# Patient Record
Sex: Female | Born: 2000 | Hispanic: No | Smoking: Current some day smoker
Health system: Southern US, Community
[De-identification: ages and names within clinical notes are randomized; demographics above are authoritative.]

---

## 2020-12-22 ENCOUNTER — Other Ambulatory Visit: Payer: Self-pay

## 2020-12-22 DIAGNOSIS — Z5321 Procedure and treatment not carried out due to patient leaving prior to being seen by health care provider: Secondary | ICD-10-CM | POA: Insufficient documentation

## 2020-12-22 DIAGNOSIS — Z3A00 Weeks of gestation of pregnancy not specified: Secondary | ICD-10-CM | POA: Insufficient documentation

## 2020-12-22 DIAGNOSIS — O26851 Spotting complicating pregnancy, first trimester: Secondary | ICD-10-CM | POA: Insufficient documentation

## 2020-12-22 DIAGNOSIS — R110 Nausea: Secondary | ICD-10-CM | POA: Insufficient documentation

## 2020-12-22 LAB — URINALYSIS, COMPLETE (UACMP) WITH MICROSCOPIC
Bilirubin Urine: NEGATIVE
Glucose, UA: NEGATIVE mg/dL
Ketones, ur: NEGATIVE mg/dL
Leukocytes,Ua: NEGATIVE
Nitrite: NEGATIVE
Protein, ur: NEGATIVE mg/dL
Specific Gravity, Urine: 1.005 (ref 1.005–1.030)
pH: 7 (ref 5.0–8.0)

## 2020-12-22 LAB — POC URINE PREG, ED: Preg Test, Ur: NEGATIVE

## 2020-12-22 NOTE — ED Triage Notes (Signed)
Pt reports that she has not had a period for over a month and thinks that she may be pregnant because she has been drinking water a lot and eats all the time and has had some nausea. She states that she began spotting today. She is worried because her grandmother had 9 miscarriages. She just wants a pregnancy test to find out for sure if she is pregnant

## 2020-12-23 ENCOUNTER — Emergency Department
Admission: EM | Admit: 2020-12-23 | Discharge: 2020-12-23 | Disposition: A | Discharge status: Home / Self Care | Payer: Self-pay | Attending: Emergency Medicine | Admitting: Emergency Medicine

## 2020-12-23 ENCOUNTER — Emergency Department: Payer: Self-pay

## 2020-12-23 ENCOUNTER — Other Ambulatory Visit: Payer: Self-pay

## 2020-12-23 DIAGNOSIS — R102 Pelvic and perineal pain: Secondary | ICD-10-CM | POA: Insufficient documentation

## 2020-12-23 DIAGNOSIS — M7989 Other specified soft tissue disorders: Secondary | ICD-10-CM | POA: Insufficient documentation

## 2020-12-23 DIAGNOSIS — R140 Abdominal distension (gaseous): Secondary | ICD-10-CM | POA: Insufficient documentation

## 2020-12-23 DIAGNOSIS — R110 Nausea: Secondary | ICD-10-CM | POA: Insufficient documentation

## 2020-12-23 LAB — COMPREHENSIVE METABOLIC PANEL
ALT: 28 U/L (ref 0–44)
AST: 23 U/L (ref 15–41)
Albumin: 4.4 g/dL (ref 3.5–5.0)
Alkaline Phosphatase: 66 U/L (ref 38–126)
Anion gap: 8 (ref 5–15)
BUN: 9 mg/dL (ref 6–20)
CO2: 25 mmol/L (ref 22–32)
Calcium: 9 mg/dL (ref 8.9–10.3)
Chloride: 106 mmol/L (ref 98–111)
Creatinine, Ser: 0.47 mg/dL (ref 0.44–1.00)
GFR, Estimated: >60 mL/min (ref >60)
Glucose, Bld: 109 mg/dL — ABNORMAL HIGH (ref 70–99)
Potassium: 3.8 mmol/L (ref 3.5–5.1)
Sodium: 139 mmol/L (ref 135–145)
Total Bilirubin: 1 mg/dL (ref 0.3–1.2)
Total Protein: 7.4 g/dL (ref 6.5–8.1)

## 2020-12-23 LAB — HCG, QUANTITATIVE, PREGNANCY: hCG, Beta Chain, Quant, S: <1 m[IU]/mL (ref <5)

## 2020-12-23 LAB — CBC WITH DIFFERENTIAL/PLATELET
Abs Immature Granulocytes: 0.05 10*3/uL (ref 0.00–0.07)
Basophils Absolute: 0.1 10*3/uL (ref 0.0–0.1)
Basophils Relative: 1 %
Eosinophils Absolute: 0.1 10*3/uL (ref 0.0–0.5)
Eosinophils Relative: 1 %
HCT: 37.8 % (ref 36.0–46.0)
Hemoglobin: 12.6 g/dL (ref 12.0–15.0)
Immature Granulocytes: 1 %
Lymphocytes Relative: 25 %
Lymphs Abs: 2.2 10*3/uL (ref 0.7–4.0)
MCH: 30.4 pg (ref 26.0–34.0)
MCHC: 33.3 g/dL (ref 30.0–36.0)
MCV: 91.1 fL (ref 80.0–100.0)
Monocytes Absolute: 0.6 10*3/uL (ref 0.1–1.0)
Monocytes Relative: 7 %
Neutro Abs: 5.9 10*3/uL (ref 1.7–7.7)
Neutrophils Relative %: 65 %
Platelets: 318 10*3/uL (ref 150–400)
RBC: 4.15 MIL/uL (ref 3.87–5.11)
RDW: 12.8 % (ref 11.5–15.5)
WBC: 8.8 10*3/uL (ref 4.0–10.5)
nRBC: 0 % (ref 0.0–0.2)

## 2020-12-23 LAB — POC URINE PREG, ED: Preg Test, Ur: NEGATIVE

## 2020-12-23 NOTE — ED Notes (Signed)
Patient verbalizes understanding of discharge instructions. Opportunity for questioning and answers were provided. Armband removed by staff, pt discharged from ED. Ambulated out to lobby  

## 2020-12-23 NOTE — ED Provider Notes (Signed)
[facility] Emergency Department Provider Note  ____________________________________________   Event Date/Time   First MD Initiated Contact with Patient 12/23/20 [phone]     (approximate)  I have reviewed the triage vital signs and the nursing notes.   HISTORY  Chief Complaint wants pregnancy test    HPI Tracey Contreras is a 20 y.o. female presents emergency department stating that she thinks she is pregnant.  Patient states her breasts have been tender, her abdomen has been bloated, she has had some swelling in her feet.  Patient is 1 month late for her menstrual cycle.  She denies any fever or chills.  Did have spotting but no large amount of bleeding.  Also complains of nausea  No past medical history on file.  There are no problems to display for this patient.     Prior to Admission medications   Not on File    Allergies Patient has no known allergies.  No family history on file.  Social History    Review of Systems  Constitutional: No fever/chills Eyes: No visual changes. ENT: No sore throat. Respiratory: Denies cough Cardiovascular: Denies chest pain Gastrointestinal: Denies abdominal pain, positive for abdominal bloating and pelvic pain Genitourinary: Negative for dysuria. Musculoskeletal: Negative for back pain. Skin: Negative for rash. Psychiatric: no mood changes,     ____________________________________________   PHYSICAL EXAM:  VITAL SIGNS: ED Triage Vitals [12/23/20 0536]  Enc Vitals Group     BP (!) 127/94     Pulse Rate (!) 102     Resp 20     Temp      Temp Source Oral     SpO2 100 %     Weight 110 lb 3.7 oz (50 kg)     Height 5\' 1"  (1.549 m)     Head Circumference      Peak Flow      Pain Score 0     Pain Loc      Pain Edu?      Excl. in GC?     Constitutional: Alert and oriented. Well appearing and in no acute distress. Eyes: Conjunctivae are normal.  Head: Atraumatic. Nose: No  congestion/rhinnorhea. Mouth/Throat: Mucous membranes are moist.   Neck:  supple no lymphadenopathy noted Cardiovascular: Normal rate, regular rhythm. Heart sounds are normal Respiratory: Normal respiratory effort.  No retractions, lungs c t a  Abd: soft nontender bs normal all 4 quad GU: deferred Musculoskeletal: FROM all extremities, warm and well perfused Neurologic:  Normal speech and language.  Skin:  Skin is warm, dry and intact. No rash noted. Psychiatric: Mood and affect are normal. Speech and behavior are normal.  ____________________________________________   LABS (all labs ordered are listed, but only abnormal results are displayed)  Labs Reviewed  COMPREHENSIVE METABOLIC PANEL - Abnormal; Notable for the following components:      Result Value   Glucose, Bld 109 (*)    All other components within normal limits  CBC WITH DIFFERENTIAL/PLATELET  HCG, QUANTITATIVE, PREGNANCY  POC URINE PREG, ED   ____________________________________________   ____________________________________________  RADIOLOGY  Ultrasound pelvis  ____________________________________________   PROCEDURES  Procedure(s) performed: No  Procedures    ____________________________________________   INITIAL IMPRESSION / ASSESSMENT AND PLAN / ED COURSE  Pertinent labs & imaging results that were available during my care of the patient were reviewed by me and considered in my medical decision making (see chart for details).   Patient's 20 year old female presents with concerns of pregnancy.  See HPI.  Physical exam shows patient be stable.  DDx: First trimester pregnancy, ectopic, ovarian cyst, false preg  Patient's labs are reassuring, CBC, comprehensive metabolic panel and beta-hCG are all negative for any acute abnormalities.  POC pregnancy is also negative  Ultrasound of the pelvis reviewed by me confirmed by radiology to be negative  Explained the findings to the patient, she is to  f/u with ob/gyn if she does not have a period this month, return to the ER if worsening abdominal pain Discharged in stable condition     Tracey Contreras was evaluated in Emergency Department on 12/23/2020 for the symptoms described in the history of present illness. She was evaluated in the context of the global COVID-19 pandemic, which necessitated consideration that the patient might be at risk for infection with the SARS-CoV-2 virus that causes COVID-19. Institutional protocols and algorithms that pertain to the evaluation of patients at risk for COVID-19 are in a state of rapid change based on information released by regulatory bodies including the CDC and federal and state organizations. These policies and algorithms were followed during the patient's care in the ED.    As part of my medical decision making, I reviewed the following data within the electronic MEDICAL RECORD NUMBER History obtained from family, Nursing notes reviewed and incorporated, Labs reviewed , Old chart reviewed, Radiograph reviewed , Notes from prior ED visits, and [place] Controlled Substance Database  ____________________________________________   FINAL CLINICAL IMPRESSION(S) / ED DIAGNOSES  Final diagnoses:  Pelvic pain  Abdominal bloating      NEW MEDICATIONS STARTED DURING THIS VISIT:  New Prescriptions   No medications on file     Note:  This document was prepared using Dragon voice recognition software and may include unintentional dictation errors.    Faythe Ghee, PA-C 12/23/20 1610    Gilles Chiquito, MD 12/23/20 [phone]

## 2020-12-23 NOTE — Discharge Instructions (Addendum)
Your pregnancy test is negative through urine and blood tests Your ultrasound is negative and does not show a current pregnancy or miscarriage Follow up with your regular doctor if not better in 3 days Return to the ER if worsening

## 2020-12-23 NOTE — ED Triage Notes (Addendum)
Pt states she had vaginal spotting last night that is gone currently. Pt states she is here to "find out how far along I am". Pt states she thinks she is pregnant, and her last period was "sometime last month". Pt was here yesterday and had a negative pregnancy test.

## 2020-12-23 NOTE — ED Provider Notes (Signed)
Emergency Medicine Provider Triage Evaluation Note  Tracey Contreras , a 20 y.o. female  was evaluated in triage.  Pt complains of persistent nausea, is concerned she could be pregnant.  Review of Systems  Positive: Nausea, abdominal distension Negative: Abdominal pain, dysuria, vomiting, diarrhea, vaginal bleeding  Physical Exam  BP (!) 127/94   Pulse (!) 102   Resp 20   Ht 5\' 1"  (1.549 m)   Wt 50 kg   LMP  (LMP Unknown)   SpO2 100%   BMI 20.83 kg/m  Gen:   Awake, no distress   Resp:  Normal effort  MSK:   Moves extremities without difficulty  Other:  No abdominal tenderness  Medical Decision Making  Medically screening exam initiated at 6:45 AM.  Appropriate orders placed.  Tracey Contreras was informed that the remainder of the evaluation will be completed by another provider, this initial triage assessment does not replace that evaluation, and the importance of remaining in the ED until their evaluation is complete.    Silverio Decamp, MD 12/23/20 [phone]

## 2021-04-20 ENCOUNTER — Encounter: Payer: Self-pay | Admitting: Emergency Medicine

## 2021-04-20 ENCOUNTER — Other Ambulatory Visit: Payer: Self-pay

## 2021-04-20 ENCOUNTER — Ambulatory Visit
Admission: EM | Admit: 2021-04-20 | Discharge: 2021-04-20 | Disposition: A | Discharge status: Home / Self Care | Payer: Self-pay | Attending: Emergency Medicine | Admitting: Emergency Medicine

## 2021-04-20 DIAGNOSIS — J069 Acute upper respiratory infection, unspecified: Secondary | ICD-10-CM | POA: Insufficient documentation

## 2021-04-20 DIAGNOSIS — L03011 Cellulitis of right finger: Secondary | ICD-10-CM | POA: Insufficient documentation

## 2021-04-20 DIAGNOSIS — Z20822 Contact with and (suspected) exposure to covid-19: Secondary | ICD-10-CM | POA: Insufficient documentation

## 2021-04-20 LAB — RAPID INFLUENZA A&B ANTIGENS
Influenza A (ARMC): NEGATIVE
Influenza B (ARMC): NEGATIVE

## 2021-04-20 MED ORDER — IPRATROPIUM BROMIDE 0.06 % NA SOLN: 2.0000 | Freq: Four times a day (QID) | NASAL | 12 refills | Status: DC

## 2021-04-20 MED ORDER — DOXYCYCLINE HYCLATE 100 MG PO CAPS
100.0000 mg | ORAL_CAPSULE | Freq: Two times a day (BID) | ORAL | 0 refills | Status: DC
Start: 2021-04-20 — End: 2021-06-18

## 2021-04-20 MED ORDER — PROMETHAZINE-PHENYLEPHRINE 6.25-5 MG/5ML PO SYRP: 5.0000 mL | ORAL_SOLUTION | Freq: Four times a day (QID) | ORAL | 0 refills | Status: DC | PRN

## 2021-04-20 MED ORDER — BENZONATATE 100 MG PO CAPS: 200.0000 mg | ORAL_CAPSULE | Freq: Three times a day (TID) | ORAL | 0 refills | Status: DC

## 2021-04-20 NOTE — ED Provider Notes (Signed)
[facility]    CSN: [id number] Arrival date & time: 04/20/21  1030      History   Chief Complaint Chief Complaint  Patient presents with   Fatigue   Cough    HPI Tracey Contreras is a 20 y.o. female.   HPI  20 year old female here for evaluation of respiratory complaints.  Patient reports that she has been experiencing runny nose with clear nasal discharge, nonproductive cough, and intermittent shortness of breath for the past 1 to 2 weeks.  Today she developed body aches and intermittent pain in the middle of her chest.  She also endorses a mild sore throat.  She denies fever, ear pain, wheezing, or GI complaints.  In addition, patient is requesting her right middle finger be evaluated because its been red, swollen, and painful for 3 days without any known injury.  History reviewed. No pertinent past medical history.  There are no problems to display for this patient.   History reviewed. No pertinent surgical history.  OB History   No obstetric history on file.      Home Medications    Prior to Admission medications   Medication Sig Start Date End Date Taking? Authorizing Provider  benzonatate (TESSALON) 100 MG capsule Take 2 capsules (200 mg total) by mouth every 8 (eight) hours. 04/20/21  Yes Becky Augusta, NP  doxycycline (VIBRAMYCIN) 100 MG capsule Take 1 capsule (100 mg total) by mouth 2 (two) times daily. 04/20/21  Yes Becky Augusta, NP  ipratropium (ATROVENT) 0.06 % nasal spray Place 2 sprays into both nostrils 4 (four) times daily. 04/20/21  Yes Becky Augusta, NP  JUNEL FE 1/20 1-20 MG-MCG tablet Take 1 tablet by mouth daily. 04/19/21  Yes [provider]  promethazine-phenylephrine (PROMETHAZINE VC) 6.25-5 MG/5ML SYRP Take 5 mLs by mouth every 6 (six) hours as needed for congestion. 04/20/21  Yes Becky Augusta, NP    Family History History reviewed. No pertinent family history.  Social History Social History   Tobacco Use   Smoking  status: Some Days    Types: Cigarettes   Smokeless tobacco: Never  Vaping Use   Vaping Use: Every day  Substance Use Topics   Alcohol use: Yes   Drug use: Never     Allergies   Patient has no known allergies.   Review of Systems Review of Systems  Constitutional:  Positive for fatigue. Negative for activity change, appetite change and fever.  HENT:  Positive for congestion, rhinorrhea and sore throat. Negative for ear pain.   Respiratory:  Positive for cough and shortness of breath. Negative for wheezing.   Cardiovascular:  Positive for chest pain.  Gastrointestinal:  Negative for diarrhea, nausea and vomiting.  Musculoskeletal:  Positive for arthralgias, joint swelling and myalgias.  Skin:  Positive for color change. Negative for rash.  Hematological: Negative.   Psychiatric/Behavioral: Negative.      Physical Exam Triage Vital Signs ED Triage Vitals  Enc Vitals Group     BP 04/20/21 1214 (!) 140/92     Pulse Rate 04/20/21 1214 (!) 110     Resp 04/20/21 1214 14     Temp 04/20/21 1214 97.8 F (36.6 C)     Temp Source 04/20/21 1214 Oral     SpO2 04/20/21 1214 100 %     Weight 04/20/21 1210 110 lb 3.7 oz (50 kg)     Height 04/20/21 1210 5\' 1"  (1.549 m)     Head Circumference --      Peak  Flow --      Pain Score 04/20/21 1210 5     Pain Loc --      Pain Edu? --      Excl. in GC? --    No data found.  Updated Vital Signs BP (!) 140/92 (BP Location: Left Arm)   Pulse (!) 110   Temp 97.8 F (36.6 C) (Oral)   Resp 14   Ht 5\' 1"  (1.549 m)   Wt 110 lb 3.7 oz (50 kg)   LMP 03/22/2021 (Exact Date)   SpO2 100%   BMI 20.83 kg/m   Visual Acuity Right Eye Distance:   Left Eye Distance:   Bilateral Distance:    Right Eye Near:   Left Eye Near:    Bilateral Near:     Physical Exam Vitals and nursing note reviewed.  Constitutional:      General: She is not in acute distress.    Appearance: Normal appearance. She is not ill-appearing.  HENT:     Head:  Normocephalic and atraumatic.     Right Ear: Tympanic membrane, ear canal and external ear normal. There is no impacted cerumen.     Left Ear: Tympanic membrane, ear canal and external ear normal. There is no impacted cerumen.     Nose: Congestion and rhinorrhea present.     Mouth/Throat:     Mouth: Mucous membranes are moist.     Pharynx: Oropharynx is clear. Posterior oropharyngeal erythema present.  Cardiovascular:     Rate and Rhythm: Normal rate and regular rhythm.     Pulses: Normal pulses.     Heart sounds: Normal heart sounds. No murmur heard.   No gallop.  Pulmonary:     Effort: Pulmonary effort is normal.     Breath sounds: Normal breath sounds. No wheezing, rhonchi or rales.  Musculoskeletal:        General: Swelling and tenderness present. No deformity or signs of injury. Normal range of motion.     Cervical back: Normal range of motion and neck supple.  Lymphadenopathy:     Cervical: No cervical adenopathy.  Skin:    General: Skin is warm and dry.     Capillary Refill: Capillary refill takes less than 2 seconds.     Findings: Erythema present. No bruising or rash.  Neurological:     General: No focal deficit present.     Mental Status: She is alert and oriented to person, place, and time.  Psychiatric:        Mood and Affect: Mood normal.        Behavior: Behavior normal.        Thought Content: Thought content normal.        Judgment: Judgment normal.     UC Treatments / Results  Labs (all labs ordered are listed, but only abnormal results are displayed) Labs Reviewed  RAPID INFLUENZA A&B ANTIGENS  SARS CORONAVIRUS 2 (TAT 6-24 HRS)    EKG   Radiology No results found.  Procedures Procedures (including critical care time)  Medications Ordered in UC Medications - No data to display  Initial Impression / Assessment and Plan / UC Course  I have reviewed the triage vital signs and the nursing notes.  Pertinent labs & imaging results that were  available during my care of the patient were reviewed by me and considered in my medical decision making (see chart for details).  Patient is a very pleasant, nontoxic-appearing 20 year old female here for evaluation of respiratory and  musculoskeletal complaints as outlined in HPI above.  Patient has been experiencing runny nose and a nonproductive cough, sore throat, and intermittent shortness of breath for the past 1 to 2 weeks and developed body aches and intermittent chest pain in the middle of her chest today.  She does not have any pain at present and reports that the pain comes and goes sporadically without any precipitating events.  Does not associated with cough or deep breathing.  Patient is also had some mild shortness of breath for the last 1 to 2 weeks as well.  She denies fever, ear pain, wheezes, or GI complaints.  She reports that her significant other has similar complaints though he also has GI symptoms.  Additionally, she is concerned because her right middle finger has been red, swollen, and painful for the last 3 days.  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  Patient's physical exam reveals pearly gray tympanic membranes bilaterally with normal light reflex and clear external auditory canals.  Nasal mucosa is erythematous and edematous with clear nasal discharge in both nares.  Oropharyngeal exam reveals mild posterior oropharyngeal erythema with clear postnasal drip.  No cervical lymphadenopathy appreciated on exam.  Cardiopulmonary exam reveals clear lung sounds in all fields.  Patient's right leg is erythematous and edematous with mild edema that is free of fluctuance or induration.  She is able to put her finger through full range of motion.  The cap refill in the finger is less than 2 seconds.  The skin  surrounding the proximal and middle phalanxes is tender to touch but there is no tenderness with palpation of the DIP or PIP joints.  On the dorsal aspect of the right middle finger at the distal aspect of the PIP joint there is a scab.  The patient indicates that she was poked by something when washing dishes a few days before the redness started.  Patient was swabbed for influenza and COVID at triage.  Influenza swab is negative and COVID swab is pending.  Patient exam is consistent with an upper respiratory infection, most likely viral, and also cellulitis of her right middle finger.  I will treat the cellulitis with doxycycline twice daily for 10 days and have instructed the patient that if her swelling continues, worsens, she develops red streaks going up her arm, fullness in her armpit, or fever she is to go to the ER for evaluation.  The upper respiratory infection we will treat with Atrovent nasal spray, Tessalon Perles, and Promethazine VC cough syrup.  Work note provided.   Final Clinical Impressions(s) / UC Diagnoses   Final diagnoses:  Viral URI with cough  Cellulitis of finger of right hand     Discharge Instructions      Take the Doxycycline twice daily with food for 10 days.  Doxycycline will make you more sensitive to sunburn so wear sunscreen when outdoors and reapply it every 90 minutes.  Apply warm compresses to help promote drainage.  Use OTC Tylenol and Ibuprofen according to the package instructions as needed for pain.  Use the Atrovent nasal spray, 2 squirts in each nostril every 6 hours, as needed for runny nose and postnasal drip.  Use the Tessalon Perles every 8 hours during the day.  Take them with a  small sip of water.  They may give you some numbness to the base of your tongue or a metallic taste in your mouth, this is normal.  Use the Promethazine VC cough syrup at bedtime for cough and congestion.  It will make you drowsy so do not take it during the  day.  Return for reevaluation or see your primary care provider for any new or worsening symptoms.   Return for new or worsening symptoms.       ED Prescriptions     Medication Sig Dispense Auth. Provider   doxycycline (VIBRAMYCIN) 100 MG capsule Take 1 capsule (100 mg total) by mouth 2 (two) times daily. 20 capsule Becky Augusta, NP   benzonatate (TESSALON) 100 MG capsule Take 2 capsules (200 mg total) by mouth every 8 (eight) hours. 21 capsule Becky Augusta, NP   ipratropium (ATROVENT) 0.06 % nasal spray Place 2 sprays into both nostrils 4 (four) times daily. 15 mL Becky Augusta, NP   promethazine-phenylephrine (PROMETHAZINE VC) 6.25-5 MG/5ML SYRP Take 5 mLs by mouth every 6 (six) hours as needed for congestion. 180 mL Becky Augusta, NP      PDMP not reviewed this encounter.   Becky Augusta, NP 04/20/21 [phone]

## 2021-04-20 NOTE — Discharge Instructions (Signed)
Take the Doxycycline twice daily with food for 10 days.  Doxycycline will make you more sensitive to sunburn so wear sunscreen when outdoors and reapply it every 90 minutes.  Apply warm compresses to help promote drainage.  Use OTC Tylenol and Ibuprofen according to the package instructions as needed for pain.  Use the Atrovent nasal spray, 2 squirts in each nostril every 6 hours, as needed for runny nose and postnasal drip.  Use the Tessalon Perles every 8 hours during the day.  Take them with a small sip of water.  They may give you some numbness to the base of your tongue or a metallic taste in your mouth, this is normal.  Use the Promethazine VC cough syrup at bedtime for cough and congestion.  It will make you drowsy so do not take it during the day.  Return for reevaluation or see your primary care provider for any new or worsening symptoms.   Return for new or worsening symptoms.

## 2021-04-20 NOTE — ED Triage Notes (Signed)
Patient c/o fatigue, cough, runny nose, and bodyaches that started 1-2 weeks ago.  Patient denies fevers.

## 2021-04-21 LAB — SARS CORONAVIRUS 2 (TAT 6-24 HRS): SARS Coronavirus 2: NEGATIVE

## 2021-06-18 ENCOUNTER — Ambulatory Visit
Admission: EM | Admit: 2021-06-18 | Discharge: 2021-06-18 | Disposition: A | Discharge status: Home / Self Care | Payer: Medicaid Other | Attending: Emergency Medicine | Admitting: Emergency Medicine

## 2021-06-18 ENCOUNTER — Other Ambulatory Visit: Payer: Self-pay

## 2021-06-18 DIAGNOSIS — R079 Chest pain, unspecified: Secondary | ICD-10-CM

## 2021-06-18 MED ORDER — ALBUTEROL SULFATE HFA 108 (90 BASE) MCG/ACT IN AERS: 2.0000 | INHALATION_SPRAY | RESPIRATORY_TRACT | 0 refills | Status: AC | PRN
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
## 2021-06-18 NOTE — Discharge Instructions (Signed)
I do not believe your symptoms today are being caused by your heart, on exam your heart sounds are normal and on your EKG your heart is beating in a regular pace and rhythm your vital signs today are also stable  However at any point if your symptoms become worse please go to the nearest emergency department for further evaluation  You may use the inhaler taking 2 puffs every 4 hours for shortness of breath and chest tightness  While you are off the next few days please try to get rest
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
## 2021-07-22 ENCOUNTER — Ambulatory Visit
Admission: EM | Admit: 2021-07-22 | Discharge: 2021-07-22 | Disposition: A | Discharge status: Home / Self Care | Payer: Self-pay | Attending: Emergency Medicine | Admitting: Emergency Medicine

## 2021-07-22 ENCOUNTER — Other Ambulatory Visit: Payer: Self-pay

## 2021-07-22 ENCOUNTER — Encounter: Payer: Self-pay | Admitting: Emergency Medicine

## 2021-07-22 DIAGNOSIS — H6122 Impacted cerumen, left ear: Secondary | ICD-10-CM | POA: Insufficient documentation

## 2021-07-22 DIAGNOSIS — Z20822 Contact with and (suspected) exposure to covid-19: Secondary | ICD-10-CM | POA: Insufficient documentation

## 2021-07-22 DIAGNOSIS — F17210 Nicotine dependence, cigarettes, uncomplicated: Secondary | ICD-10-CM | POA: Insufficient documentation

## 2021-07-22 DIAGNOSIS — J069 Acute upper respiratory infection, unspecified: Secondary | ICD-10-CM | POA: Insufficient documentation

## 2021-07-22 MED ORDER — KETOROLAC TROMETHAMINE 60 MG/2ML IM SOLN
30.0000 mg | Freq: Once | INTRAMUSCULAR | Status: AC
  Administered 2021-07-22: 30 mg via INTRAMUSCULAR

## 2021-07-22 MED ORDER — IBUPROFEN 800 MG PO TABS: 800.0000 mg | ORAL_TABLET | Freq: Three times a day (TID) | ORAL | 0 refills | Status: AC

## 2021-07-22 NOTE — ED Provider Notes (Signed)
[facility]    CSN: [id number] Arrival date & time: 07/22/21  1507      History   Chief Complaint Chief Complaint  Patient presents with   Headache   Cough   Fatigue    HPI Tracey Contreras is a 21 y.o. female.   Patient presents with nasal congestion for 3 to 4 days, nonproductive cough, fatigue and generalized headache beginning this morning.  Decreased appetite but tolerating fluids.  Has attempted use of an antihistamine which has been minimally helpful.  Beginning this morning, congestion for 3-4 days took antihistamine, decreased appetite.  Known sick contacts.  No pertinent medical history.  Occasional tobacco use.  History reviewed. No pertinent past medical history.  There are no problems to display for this patient.   History reviewed. No pertinent surgical history.  OB History   No obstetric history on file.      Home Medications    Prior to Admission medications   Medication Sig Start Date End Date Taking? Authorizing Provider  albuterol (VENTOLIN HFA) 108 (90 Base) MCG/ACT inhaler Inhale 2 puffs into the lungs every 4 (four) hours as needed for wheezing or shortness of breath. 06/18/21   Hans Eden, NP  JUNEL FE 1/20 1-20 MG-MCG tablet Take 1 tablet by mouth daily. 04/19/21   [provider]    Family History History reviewed. No pertinent family history.  Social History Social History   Tobacco Use   Smoking status: Some Days    Types: Cigarettes   Smokeless tobacco: Never  Vaping Use   Vaping Use: Every day  Substance Use Topics   Alcohol use: Yes    Comment: social   Drug use: Never     Allergies   Patient has no known allergies.   Review of Systems Review of Systems  Constitutional: Negative.   HENT:  Positive for congestion. Negative for dental problem, drooling, ear discharge, ear pain, facial swelling, hearing loss, mouth sores, nosebleeds, postnasal drip, rhinorrhea, sinus pressure, sinus pain,  sneezing, sore throat, tinnitus, trouble swallowing and voice change.   Respiratory:  Positive for cough. Negative for apnea, choking, chest tightness, shortness of breath, wheezing and stridor.   Cardiovascular: Negative.   Gastrointestinal: Negative.   Skin: Negative.   Neurological:  Positive for headaches. Negative for dizziness, tremors, seizures, syncope, facial asymmetry, speech difficulty, weakness, light-headedness and numbness.    Physical Exam Triage Vital Signs ED Triage Vitals  Enc Vitals Group     BP 07/22/21 1525 117/84     Pulse Rate 07/22/21 1525 90     Resp 07/22/21 1525 14     Temp 07/22/21 1525 98.6 F (37 C)     Temp Source 07/22/21 1525 Oral     SpO2 07/22/21 1525 100 %     Weight 07/22/21 1522 117 lb 1 oz (53.1 kg)     Height 07/22/21 1522 5\' 1"  (1.549 m)     Head Circumference --      Peak Flow --      Pain Score 07/22/21 1522 10     Pain Loc --      Pain Edu? --      Excl. in [place]? --    No data found.  Updated Vital Signs BP 117/84 (BP Location: Left Arm)    Pulse 90    Temp 98.6 F (37 C) (Oral)    Resp 14    Ht 5\' 1"  (1.549 m)    Wt 117 lb 1  oz (53.1 kg)    LMP 07/15/2021 (Approximate)    SpO2 100%    BMI 22.12 kg/m   Visual Acuity Right Eye Distance:   Left Eye Distance:   Bilateral Distance:    Right Eye Near:   Left Eye Near:    Bilateral Near:     Physical Exam Constitutional:      Appearance: Normal appearance. She is well-developed.  HENT:     Head: Normocephalic.     Right Ear: Tympanic membrane, ear canal and external ear normal.     Left Ear: Ear canal and external ear normal. There is impacted cerumen.     Nose: Congestion present. No rhinorrhea.     Mouth/Throat:     Mouth: Mucous membranes are moist.     Pharynx: Oropharynx is clear.  Eyes:     Extraocular Movements: Extraocular movements intact.  Cardiovascular:     Rate and Rhythm: Normal rate and regular rhythm.     Pulses: Normal pulses.     Heart sounds: Normal  heart sounds.  Pulmonary:     Effort: Pulmonary effort is normal.     Breath sounds: Normal breath sounds.  Musculoskeletal:        General: Normal range of motion.     Cervical back: Normal range of motion and neck supple.  Skin:    General: Skin is warm and dry.  Neurological:     Mental Status: She is alert and oriented to person, place, and time. Mental status is at baseline.  Psychiatric:        Mood and Affect: Mood normal.        Behavior: Behavior normal.     UC Treatments / Results  Labs (all labs ordered are listed, but only abnormal results are displayed) Labs Reviewed  SARS CORONAVIRUS 2 (TAT 6-24 HRS)    EKG   Radiology No results found.  Procedures Procedures (including critical care time)  Medications Ordered in UC Medications - No data to display  Initial Impression / Assessment and Plan / UC Course  I have reviewed the triage vital signs and the nursing notes.  Pertinent labs & imaging results that were available during my care of the patient were reviewed by me and considered in my medical decision making (see chart for details).  Viral URI with cough  Vital signs are stable, patient is in no signs of distress, O2 saturation 100% on room air, lungs clear to auscultation, stable for outpatient treatment, COVID test is pending, IM injection given today for management of headache, ibuprofen 800 mg sent for outpatient use, recommend over-the-counter medication for additional supportive care, may follow-up with urgent care as needed, work note given Final Clinical Impressions(s) / UC Diagnoses   Final diagnoses:  None   Discharge Instructions   None    ED Prescriptions   None    PDMP not reviewed this encounter.   Hans Eden, NP 07/24/21 1702

## 2021-07-22 NOTE — ED Triage Notes (Signed)
Patient c/o cough, headache, and fatigue that started today.  Patient unsure of fevers.

## 2021-07-22 NOTE — Discharge Instructions (Signed)
Your symptoms today are most likely being caused by a virus and should steadily improve in time it can take up to 7 to 10 days before you truly start to see a turnaround however things will get better    May use ibuprofen as needed for comfort for your headache  May attempt any of the following below symptoms occur  For cough: honey 1/2 to 1 teaspoon (you can dilute the honey in water or another fluid).  You can also use guaifenesin and dextromethorphan for cough. You can use a humidifier for chest congestion and cough.  If you don't have a humidifier, you can sit in the bathroom with the hot shower running.      For sore throat: try warm salt water gargles, cepacol lozenges, throat spray, warm tea or water with lemon/honey, popsicles or ice, or OTC cold relief medicine for throat discomfort.   For congestion: take a daily anti-histamine like Zyrtec, Claritin, and a oral decongestant, such as pseudoephedrine.  You can also use Flonase 1-2 sprays in each nostril daily.   It is important to stay hydrated: drink plenty of fluids (water, gatorade/powerade/pedialyte, juices, or teas) to keep your throat moisturized and help further relieve irritation/discomfort.

## 2021-07-23 LAB — SARS CORONAVIRUS 2 (TAT 6-24 HRS): SARS Coronavirus 2: NEGATIVE

## 2022-12-11 IMAGING — US US PELVIS COMPLETE WITH TRANSVAGINAL
1 series · 14 of 25 positions shown · non-contrast
Comparison: None

CLINICAL DATA: Pelvic pain for 2 days.



[Series 1: us pelvic complete with transvaginal · 14 of 89 slices shown]
[im 1/89]
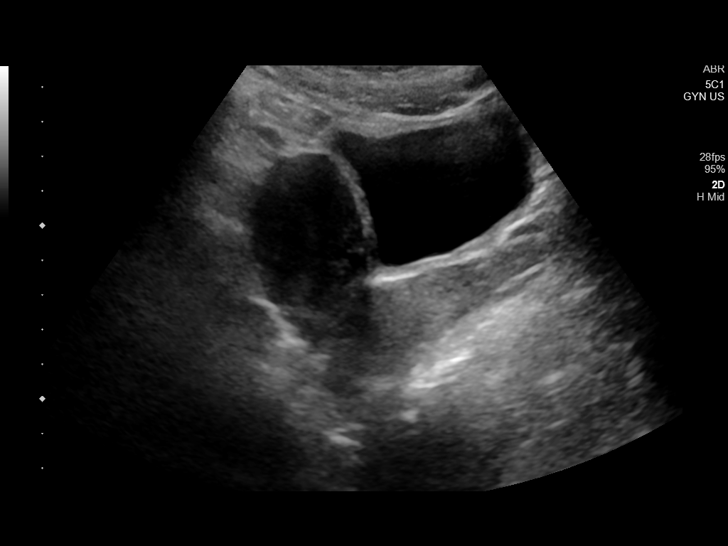
[im 8/89]
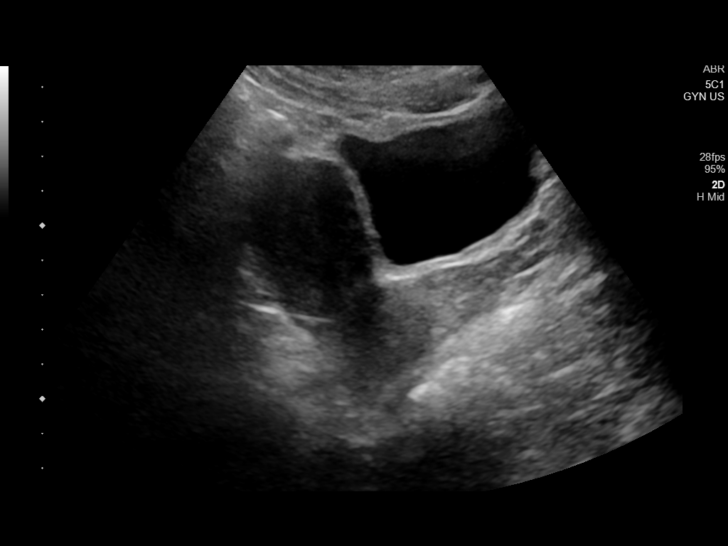
[im 15/89]
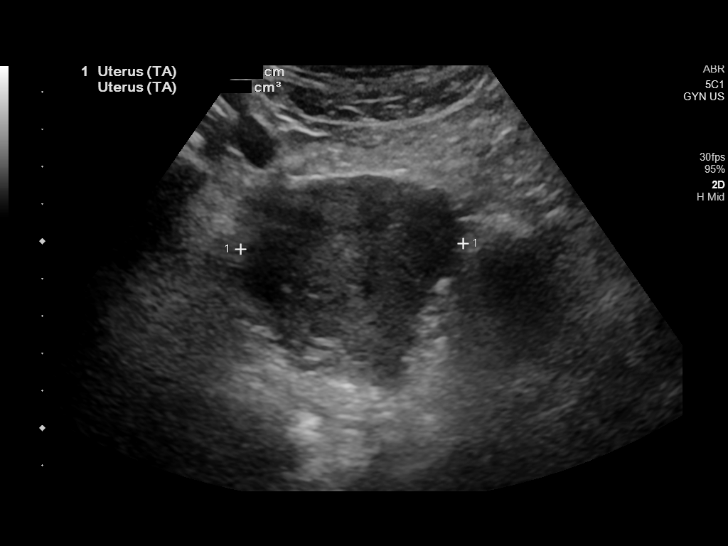
[im 23/89]
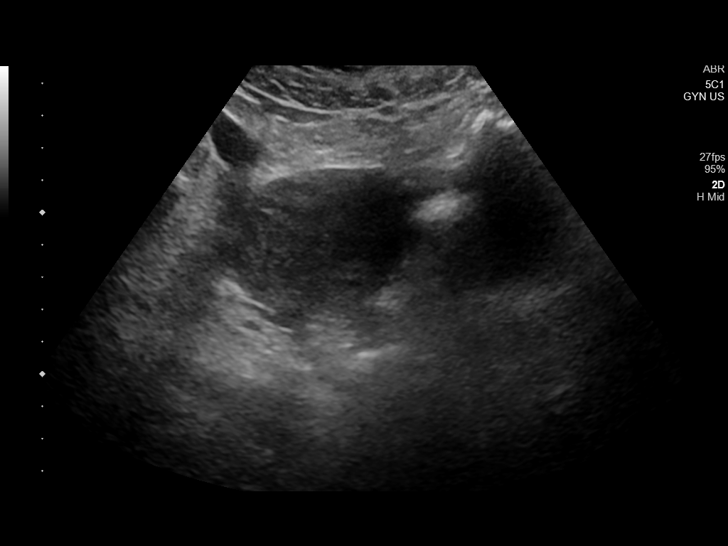
[im 30/89]
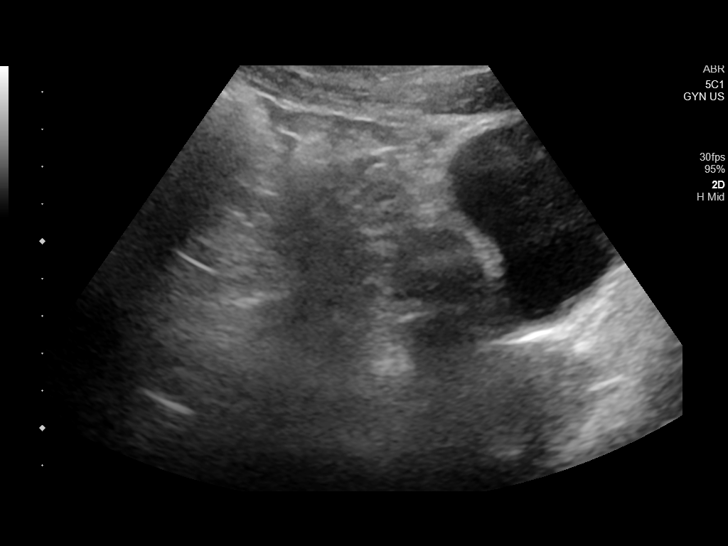
[im 34/89]
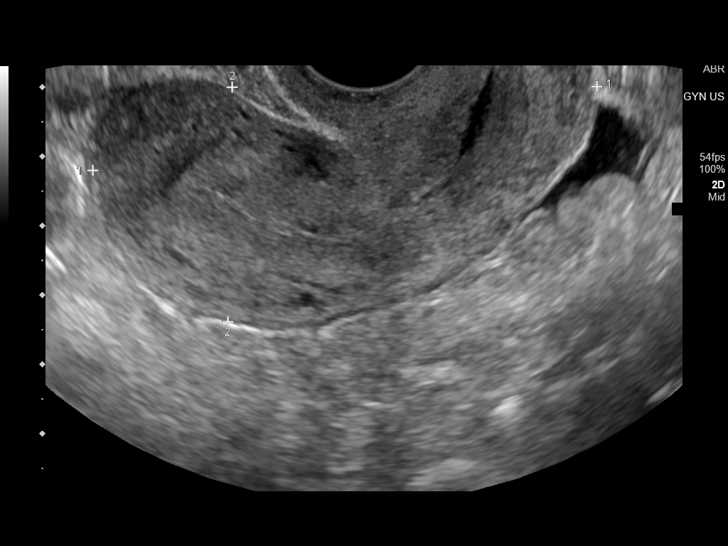
[im 41/89]
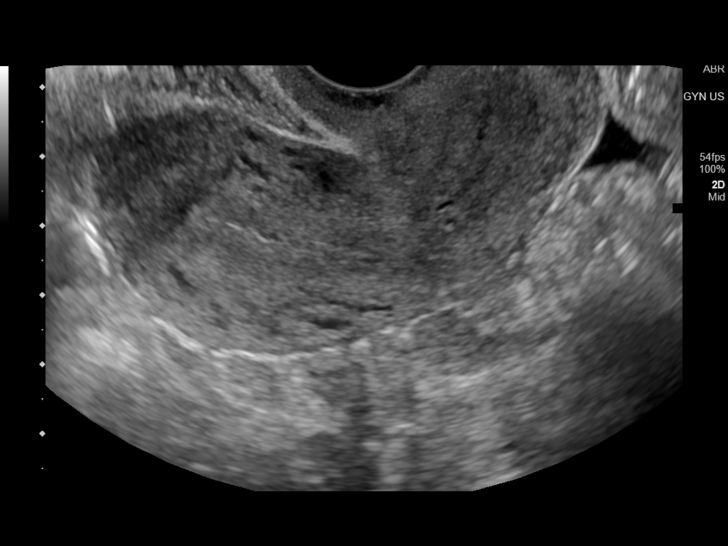
[im 48/89]
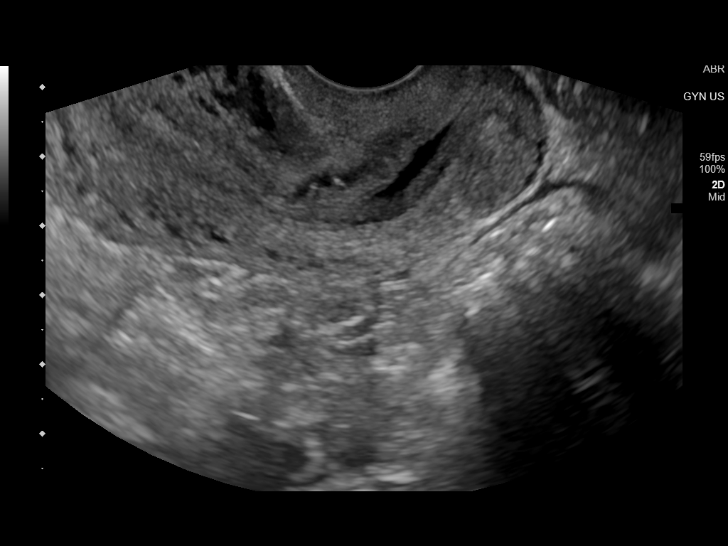
[im 56/89]
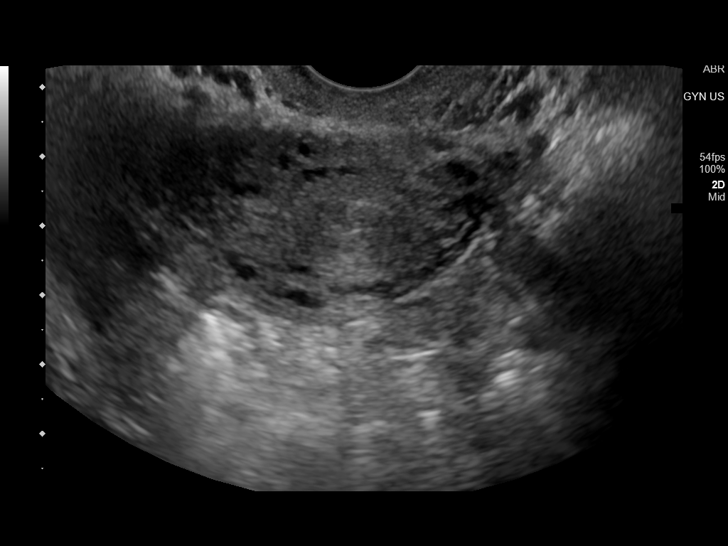
[im 59/89]
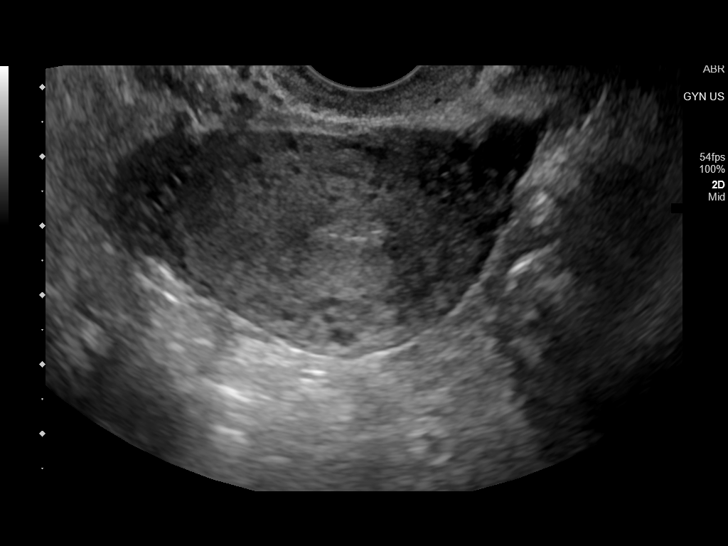
[im 67/89]
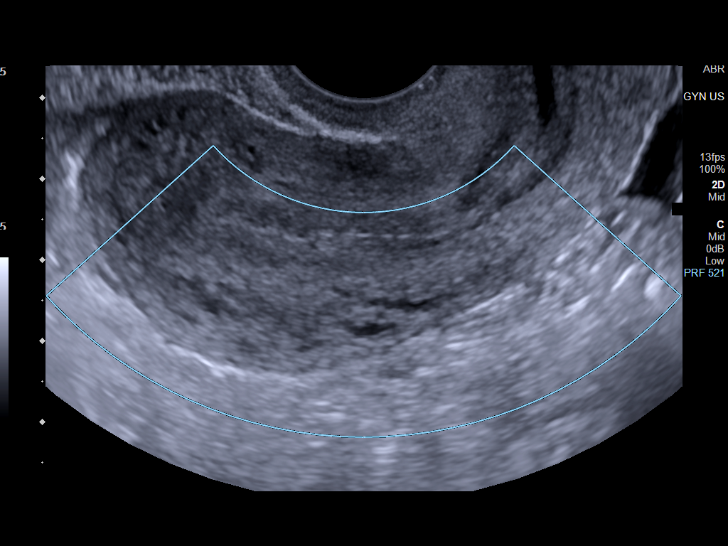
[im 74/89]
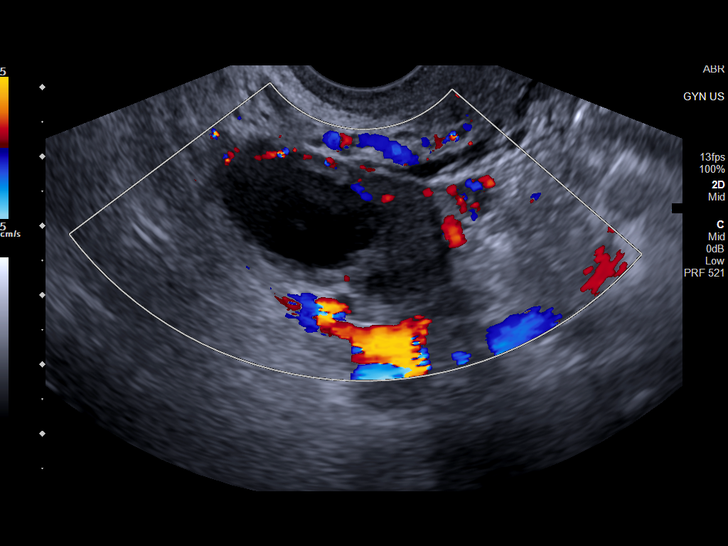
[im 81/89]
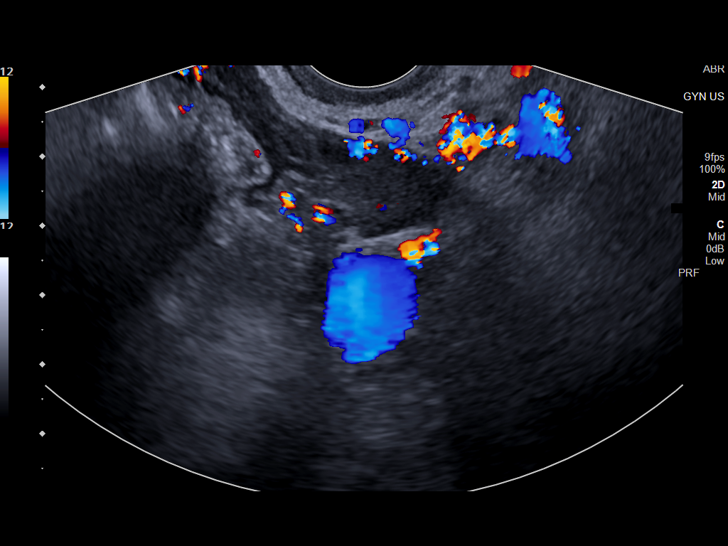
[im 89/89]
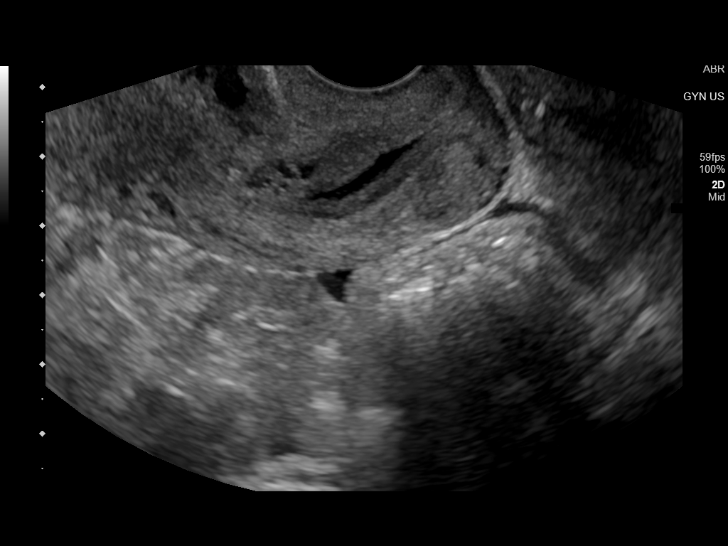

[14 of 25 positions shown; findings below may reference images not displayed]

FINDINGS: Uterus

Measurements: 7.4 x 3.4 x 5.0 cm = volume: 66 mL. No fibroids or
other mass visualized.

Endometrium

Thickness: 8 mm.  No focal abnormality visualized.

Right ovary

Measurements: 3.4 x 2.0 x 2.2 cm = volume: 7.4 mL. Normal
appearance/no adnexal mass.

Left ovary

Measurements: 1.7 x 1.5 x 2.4 cm = volume: 3.1 mL. Normal
appearance/no adnexal mass.

Other findings

Trace free fluid in the cul-de-sac. This can be physiologic in a
premenopausal female.
IMPRESSION: No acute abnormality. No evidence to explain the patient's history
of pelvic pain.
